# Patient Record
Sex: Male | Born: 1985 | Race: White | Hispanic: No | Marital: Single | Smoking: Former smoker
Health system: Southern US, Community
[De-identification: ages and names within clinical notes are randomized; demographics above are authoritative.]

## PROBLEM LIST (undated history)

## (undated) DIAGNOSIS — F1011 Alcohol abuse, in remission: Secondary | ICD-10-CM

## (undated) DIAGNOSIS — F429 Obsessive-compulsive disorder, unspecified: Secondary | ICD-10-CM

## (undated) DIAGNOSIS — F32A Depression, unspecified: Secondary | ICD-10-CM

## (undated) DIAGNOSIS — R569 Unspecified convulsions: Secondary | ICD-10-CM

## (undated) HISTORY — PX: TIBIA FRACTURE SURGERY: SHX806

## (undated) HISTORY — DX: Depression, unspecified: F32.A

## (undated) HISTORY — DX: Alcohol abuse, in remission: F10.11

## (undated) HISTORY — PX: APPENDECTOMY: SHX54

## (undated) HISTORY — DX: Obsessive-compulsive disorder, unspecified: F42.9

---

## 2016-01-03 ENCOUNTER — Other Ambulatory Visit: Payer: Self-pay | Admitting: Family Medicine

## 2016-01-03 DIAGNOSIS — R8281 Pyuria: Secondary | ICD-10-CM

## 2016-01-08 ENCOUNTER — Ambulatory Visit
Admission: RE | Admit: 2016-01-08 | Discharge: 2016-01-08 | Disposition: A | Discharge status: Home / Self Care | Payer: BLUE CROSS/BLUE SHIELD | Source: Ambulatory Visit | Attending: Family Medicine | Admitting: Family Medicine

## 2016-01-08 DIAGNOSIS — R8281 Pyuria: Secondary | ICD-10-CM

## 2016-01-08 DIAGNOSIS — N261 Atrophy of kidney (terminal): Secondary | ICD-10-CM | POA: Insufficient documentation

## 2016-01-08 DIAGNOSIS — N390 Urinary tract infection, site not specified: Secondary | ICD-10-CM | POA: Diagnosis present

## 2017-07-17 ENCOUNTER — Encounter: Payer: Self-pay | Admitting: Emergency Medicine

## 2017-07-17 ENCOUNTER — Emergency Department
Admission: EM | Admit: 2017-07-17 | Discharge: 2017-07-17 | Disposition: A | Discharge status: Home / Self Care | Payer: BLUE CROSS/BLUE SHIELD | Attending: Emergency Medicine | Admitting: Emergency Medicine

## 2017-07-17 DIAGNOSIS — R1012 Left upper quadrant pain: Secondary | ICD-10-CM | POA: Diagnosis not present

## 2017-07-17 DIAGNOSIS — R197 Diarrhea, unspecified: Secondary | ICD-10-CM | POA: Insufficient documentation

## 2017-07-17 DIAGNOSIS — R112 Nausea with vomiting, unspecified: Secondary | ICD-10-CM | POA: Diagnosis not present

## 2017-07-17 DIAGNOSIS — Z79899 Other long term (current) drug therapy: Secondary | ICD-10-CM | POA: Insufficient documentation

## 2017-07-17 HISTORY — DX: Unspecified convulsions: R56.9

## 2017-07-17 LAB — COMPREHENSIVE METABOLIC PANEL
ALK PHOS: 76 U/L (ref 38–126)
ALT: 30 U/L (ref 17–63)
AST: 27 U/L (ref 15–41)
Albumin: 4.8 g/dL (ref 3.5–5.0)
Anion gap: 13 (ref 5–15)
BILIRUBIN TOTAL: 1 mg/dL (ref 0.3–1.2)
BUN: 16 mg/dL (ref 6–20)
CALCIUM: 9.8 mg/dL (ref 8.9–10.3)
CO2: 26 mmol/L (ref 22–32)
CREATININE: 1.27 mg/dL — AB (ref 0.61–1.24)
Chloride: 102 mmol/L (ref 101–111)
GFR calc non Af Amer: >60 mL/min (ref >60)
GLUCOSE: 122 mg/dL — AB (ref 65–99)
Potassium: 4.2 mmol/L (ref 3.5–5.1)
SODIUM: 141 mmol/L (ref 135–145)
TOTAL PROTEIN: 7.8 g/dL (ref 6.5–8.1)

## 2017-07-17 LAB — URINALYSIS, COMPLETE (UACMP) WITH MICROSCOPIC
Bacteria, UA: NONE SEEN
Bilirubin Urine: NEGATIVE
GLUCOSE, UA: NEGATIVE mg/dL
Hgb urine dipstick: NEGATIVE
Ketones, ur: 80 mg/dL — AB
Leukocytes, UA: NEGATIVE
Nitrite: NEGATIVE
PH: 5 (ref 5.0–8.0)
Protein, ur: 30 mg/dL — AB
RBC / HPF: NONE SEEN RBC/hpf (ref 0–5)
Specific Gravity, Urine: 1.028 (ref 1.005–1.030)
Squamous Epithelial / LPF: NONE SEEN

## 2017-07-17 LAB — CBC
HCT: 43.5 % (ref 40.0–52.0)
Hemoglobin: 14.9 g/dL (ref 13.0–18.0)
MCH: 30.6 pg (ref 26.0–34.0)
MCHC: 34.3 g/dL (ref 32.0–36.0)
MCV: 89.1 fL (ref 80.0–100.0)
PLATELETS: 257 10*3/uL (ref 150–440)
RBC: 4.89 MIL/uL (ref 4.40–5.90)
RDW: 13.9 % (ref 11.5–14.5)
WBC: 13.6 10*3/uL — ABNORMAL HIGH (ref 3.8–10.6)

## 2017-07-17 LAB — LIPASE, BLOOD: Lipase: 22 U/L (ref 11–51)

## 2017-07-17 MED ORDER — HYDROCODONE-ACETAMINOPHEN 5-325 MG PO TABS: 1.0000 | ORAL_TABLET | Freq: Four times a day (QID) | ORAL | 0 refills | Status: DC | PRN

## 2017-07-17 MED ORDER — FAMOTIDINE 20 MG PO TABS
ORAL_TABLET | ORAL | Status: AC
  Administered 2017-07-17: 40 mg via ORAL
  Filled 2017-07-17: qty 2

## 2017-07-17 MED ORDER — GI COCKTAIL ~~LOC~~: 30.0000 mL | Freq: Once | ORAL | Status: DC

## 2017-07-17 MED ORDER — FAMOTIDINE 20 MG PO TABS
40.0000 mg | ORAL_TABLET | Freq: Once | ORAL | Status: AC
  Administered 2017-07-17: 40 mg via ORAL

## 2017-07-17 MED ORDER — HYDROCODONE-ACETAMINOPHEN 5-325 MG PO TABS
2.0000 | ORAL_TABLET | Freq: Once | ORAL | Status: AC
  Administered 2017-07-17: 2 via ORAL
  Filled 2017-07-17: qty 2

## 2017-07-17 NOTE — Discharge Instructions (Signed)
Please take Pepcid twice a day for the next few weeks to help your stomach pain. Make an appointment to follow-up with your primary care physician as needed and return to the emergency department for any new or worsening symptoms such as if you cannot eat or drink, worsening pain, or for any other concerns whatsoever.  It was a pleasure to take care of you today, and thank you for coming to our emergency department.  If you have any questions or concerns before leaving please ask the nurse to grab me and I'm more than happy to go through your aftercare instructions again.  If you were prescribed any opioid pain medication today such as Norco, Vicodin, Percocet, morphine, hydrocodone, or oxycodone please make sure you do not drive when you are taking this medication as it can alter your ability to drive safely.  If you have any concerns once you are home that you are not improving or are in fact getting worse before you can make it to your follow-up appointment, please do not hesitate to call 911 and come back for further evaluation.  Merrily Brittle, MD  Results for orders placed or performed during the hospital encounter of 07/17/17  Lipase, blood  Result Value Ref Range   Lipase 22 11 - 51 U/L  Comprehensive metabolic panel  Result Value Ref Range   Sodium 141 135 - 145 mmol/L   Potassium 4.2 3.5 - 5.1 mmol/L   Chloride 102 101 - 111 mmol/L   CO2 26 22 - 32 mmol/L   Glucose, Bld 122 (H) 65 - 99 mg/dL   BUN 16 6 - 20 mg/dL   Creatinine, Ser 1.61 (H) 0.61 - 1.24 mg/dL   Calcium 9.8 8.9 - 09.6 mg/dL   Total Protein 7.8 6.5 - 8.1 g/dL   Albumin 4.8 3.5 - 5.0 g/dL   AST 27 15 - 41 U/L   ALT 30 17 - 63 U/L   Alkaline Phosphatase 76 38 - 126 U/L   Total Bilirubin 1.0 0.3 - 1.2 mg/dL   GFR calc non Af Amer >60 >60 mL/min   GFR calc Af Amer >60 >60 mL/min   Anion gap 13 5 - 15  CBC  Result Value Ref Range   WBC 13.6 (H) 3.8 - 10.6 K/uL   RBC 4.89 4.40 - 5.90 MIL/uL   Hemoglobin 14.9 13.0  - 18.0 g/dL   HCT 04.5 40.9 - 81.1 %   MCV 89.1 80.0 - 100.0 fL   MCH 30.6 26.0 - 34.0 pg   MCHC 34.3 32.0 - 36.0 g/dL   RDW 91.4 78.2 - 95.6 %   Platelets 257 150 - 440 K/uL  Urinalysis, Complete w Microscopic  Result Value Ref Range   Color, Urine YELLOW (A) YELLOW   APPearance CLEAR (A) CLEAR   Specific Gravity, Urine 1.028 1.005 - 1.030   pH 5.0 5.0 - 8.0   Glucose, UA NEGATIVE NEGATIVE mg/dL   Hgb urine dipstick NEGATIVE NEGATIVE   Bilirubin Urine NEGATIVE NEGATIVE   Ketones, ur 80 (A) NEGATIVE mg/dL   Protein, ur 30 (A) NEGATIVE mg/dL   Nitrite NEGATIVE NEGATIVE   Leukocytes, UA NEGATIVE NEGATIVE   RBC / HPF NONE SEEN 0 - 5 RBC/hpf   WBC, UA 0-5 0 - 5 WBC/hpf   Bacteria, UA NONE SEEN NONE SEEN   Squamous Epithelial / LPF NONE SEEN NONE SEEN   Mucus PRESENT

## 2017-07-17 NOTE — ED Triage Notes (Signed)
Pt ambulatory to triage with no difficulty. Pt reports sudden onset of upper left abd pain around 1 pm. Pt states he has felt like he needed to use the bathroom but could not. Pt denies n/v/d. Pt was seen at [facility] and told possible pancreatitis and was advised to come to the ED for further evaluation.

## 2017-07-17 NOTE — ED Provider Notes (Signed)
[facility] Emergency Department Provider Note  ____________________________________________   First MD Initiated Contact with Patient 07/17/17 2140     (approximate)  I have reviewed the triage vital signs and the nursing notes.   HISTORY  Chief Complaint Abdominal Pain   HPI Stuart Patterson is a 31 y.o. male who is sent to the emergency department from the [facility] for evaluation of abdominal pain. The patient says at 1 PM he had sudden onset severe left upper quadrant abdominal pain. It began shortly after eating a sandwich from a supermarket. The pain is searing aching nonradiating. It seemed to get better somewhat when given a GI cocktail clinic and definitely improved after receiving hydrocodone. He's had some nausea and forced himself to vomit once which did not help He had one loose stool.He has no history of abdominal surgeries. He's had no hematuria or dysuria. He was told in the clinic that there was "a 95% chance this was pancreatitis".   Past Medical History:  Diagnosis Date  . Seizures (HCC)     There are no active problems to display for this patient.   Past Surgical History:  Procedure Laterality Date  . TIBIA FRACTURE SURGERY Right     Prior to Admission medications   Medication Sig Start Date End Date Taking? Authorizing Provider  lamoTRIgine (LAMICTAL) 200 MG tablet Take 200 mg by mouth daily.   Yes [provider]  HYDROcodone-acetaminophen (NORCO) 5-325 MG tablet Take 1 tablet by mouth every 6 (six) hours as needed for severe pain. 07/17/17   Merrily Brittle, MD    Allergies Demerol [meperidine] and Morphine and related  History reviewed. No pertinent family history.  Social History Social History  Substance Use Topics  . Smoking status: Never Smoker  . Smokeless tobacco: Current User  . Alcohol use Not on file    Review of Systems Constitutional: No fever/chills ENT: No sore throat. Cardiovascular:  Denies chest pain. Respiratory: Denies shortness of breath. Gastrointestinal: Positive for abdominal pain.  Positive for nausea, positive for vomiting.  Positive for diarrhea.  No constipation. Musculoskeletal: Negative for back pain. Neurological: Negative for headaches   ____________________________________________   PHYSICAL EXAM:  VITAL SIGNS: ED Triage Vitals  Enc Vitals Group     BP 07/17/17 2033 (!) 102/56     Pulse Rate 07/17/17 2033 70     Resp 07/17/17 2033 18     Temp 07/17/17 2033 98.2 F (36.8 C)     Temp Source 07/17/17 2033 Oral     SpO2 07/17/17 2033 98 %     Weight 07/17/17 2034 205 lb (93 kg)     Height 07/17/17 2034  (1.778 m)     Head Circumference --      Peak Flow --      Pain Score 07/17/17 2033 6     Pain Loc --      Pain Edu? --      Excl. in GC? --     Constitutional: Alert and oriented 4 joking laughing well appearing nontoxic no diaphoresis speaks in full clear sentences Head: Atraumatic. Nose: No congestion/rhinnorhea. Mouth/Throat: No trismus Neck: No stridor.   Cardiovascular: Regular rate and rhythm Respiratory: Normal respiratory effort.  No retractions. Gastrointestinal: Soft nondistended nontender no rebound no guarding no peritonitis no McBurney's tenderness negative Rovsing's no costovertebral tenderness negative Murphy's Neurologic:  Normal speech and language. No gross focal neurologic deficits are appreciated.  Skin:  Skin is warm, dry and intact. No  rash noted.    ____________________________________________  LABS (all labs ordered are listed, but only abnormal results are displayed)  Labs Reviewed  COMPREHENSIVE METABOLIC PANEL - Abnormal; Notable for the following:       Result Value   Glucose, Bld 122 (*)    Creatinine, Ser 1.27 (*)    All other components within normal limits  CBC - Abnormal; Notable for the following:    WBC 13.6 (*)    All other components within normal limits  URINALYSIS, COMPLETE  (UACMP) WITH MICROSCOPIC - Abnormal; Notable for the following:    Color, Urine YELLOW (*)    APPearance CLEAR (*)    Ketones, ur 80 (*)    Protein, ur 30 (*)    All other components within normal limits  LIPASE, BLOOD    Blood work reviewed and interpreted by me shows elevated white count which is nonspecific as well as slightly elevated creatinine likely secondary to dehydration __________________________________________  EKG   ____________________________________________  RADIOLOGY   ____________________________________________   PROCEDURES  Procedure(s) performed: no  Procedures  Critical Care performed: no  Observation: no ____________________________________________   INITIAL IMPRESSION / ASSESSMENT AND PLAN / ED COURSE  Pertinent labs & imaging results that were available during my care of the patient were reviewed by me and considered in my medical decision making (see chart for details).  By the time I saw the patient his pain was resolved and he is extremely well-appearing. His pain is all isolated in his left upper quadrant. It did improve with the GI cocktail. Fortunately his lipase is negative and he is able to eat and drink. I had a lengthy discussion with mom and dad and the patient regarding the diagnostic uncertainty of left upper quadrant pain but that I did not believe he had pancreatitis and I did not believe she warrants advanced imaging at this time as it would be unnecessary radiation without benefit. He verbalized understanding and agreement with the plan. We will give a trial of H2 blocker for the next several weeks with strict return precautions. The patient verbalizes understanding and agreement with the plan.      ____________________________________________   FINAL CLINICAL IMPRESSION(S) / ED DIAGNOSES  Final diagnoses:  Left upper quadrant pain      NEW MEDICATIONS STARTED DURING THIS VISIT:  Discharge Medication List as of  07/17/2017  9:55 PM    START taking these medications   Details  HYDROcodone-acetaminophen (NORCO) 5-325 MG tablet Take 1 tablet by mouth every 6 (six) hours as needed for severe pain., Starting Fri 07/17/2017, Print         Note:  This document was prepared using Dragon voice recognition software and may include unintentional dictation errors.      Merrily Brittle, MD 07/18/17 1439

## 2017-07-17 NOTE — ED Notes (Signed)
Spoke with Dr Mayford Knife about pt and his prior allergies. Pt thinks he takes vicodin ok and order received for the same.

## 2017-08-27 IMAGING — US US RENAL
1 series · 14 of 25 positions shown · non-contrast
Comparison: None.

CLINICAL DATA: Pyuria.

EXAM:
RENAL / URINARY TRACT ULTRASOUND COMPLETE

[Series 1: us renal · 0.26mm/px · 14 of 37 slices shown]
[im 1/37]
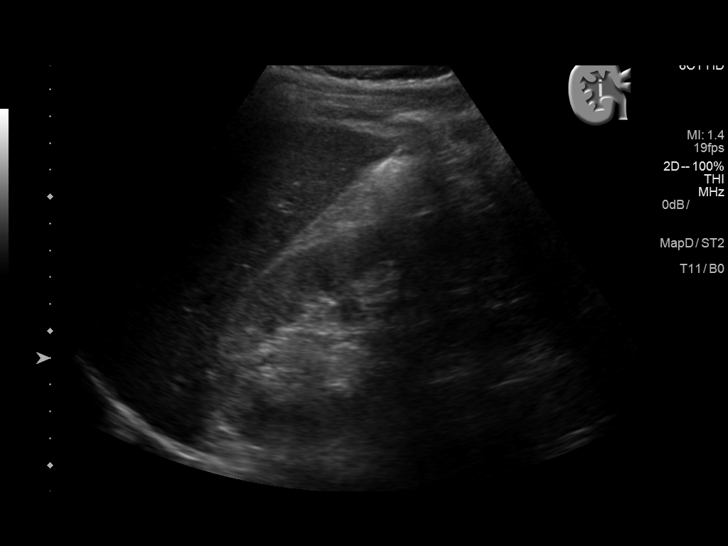
[im 4/37]
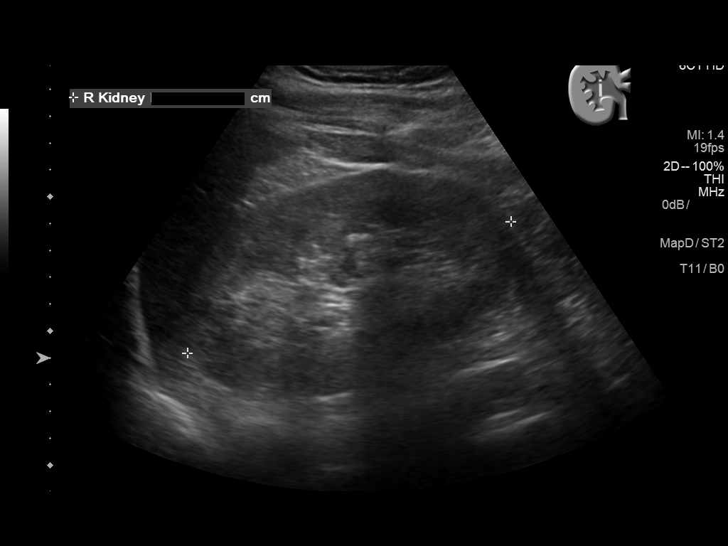
[im 7/37]
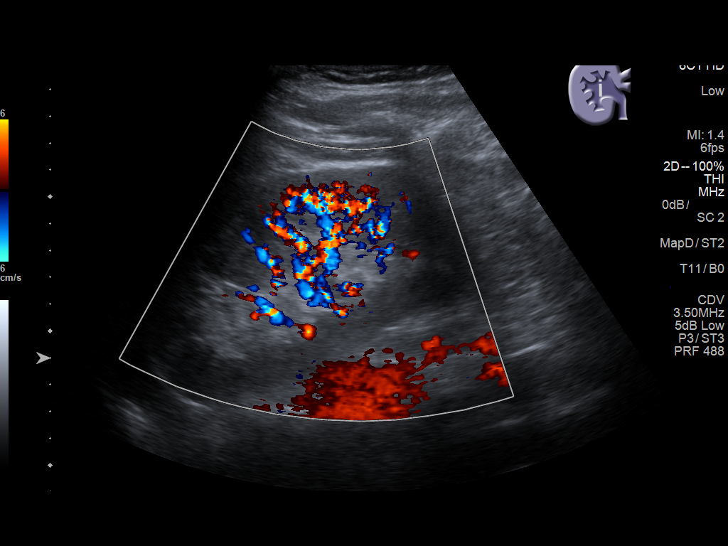
[im 10/37]
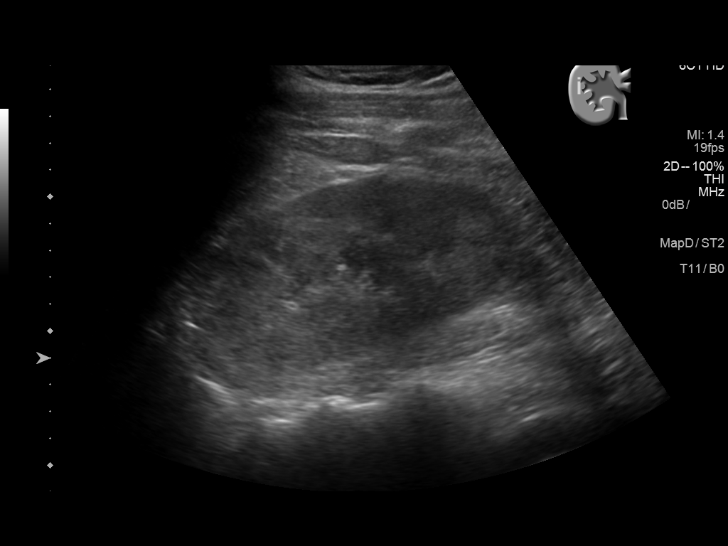
[im 13/37]
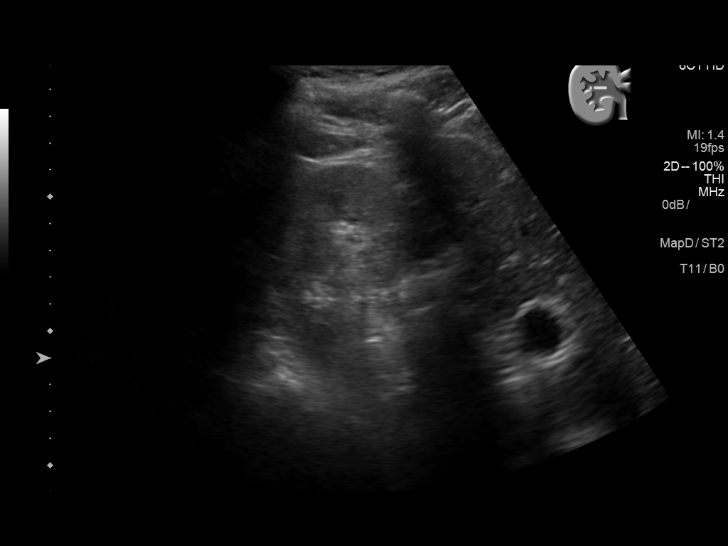
[im 14/37]
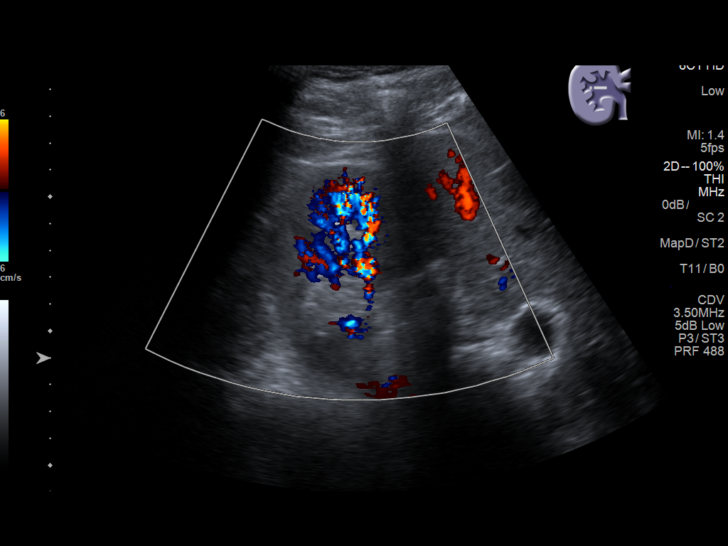
[im 17/37]
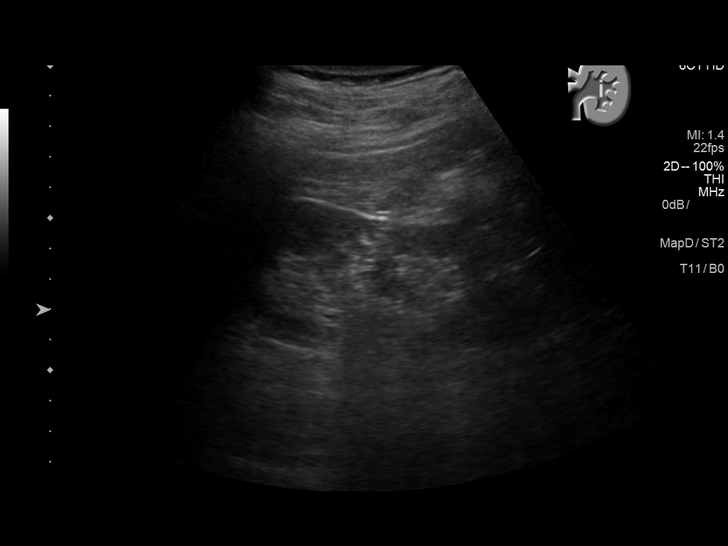
[im 20/37]
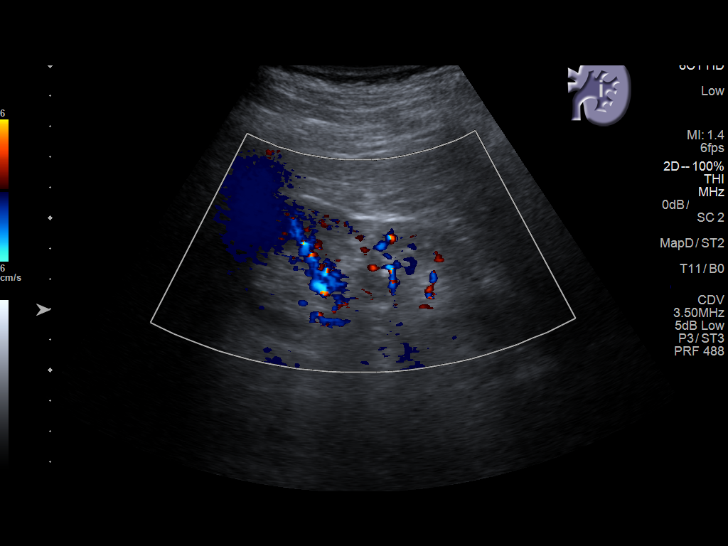
[im 23/37]
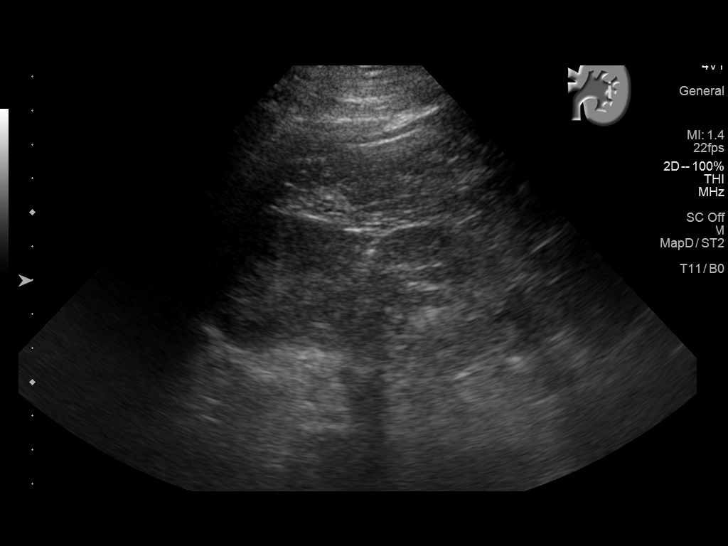
[im 25/37]
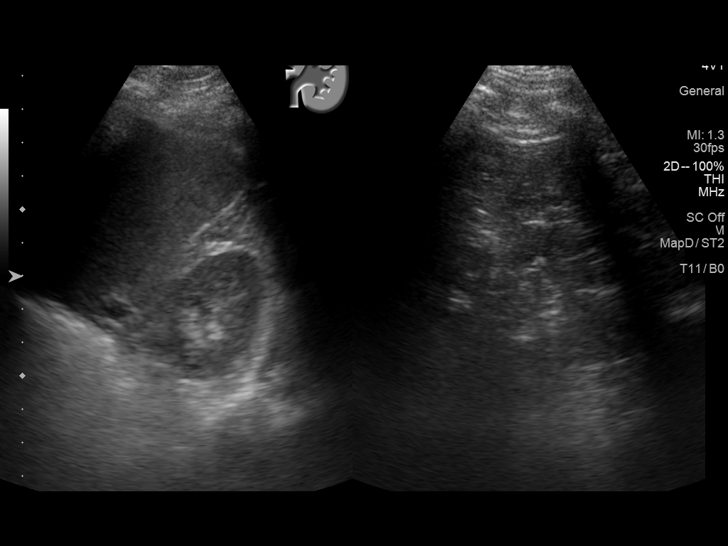
[im 28/37]
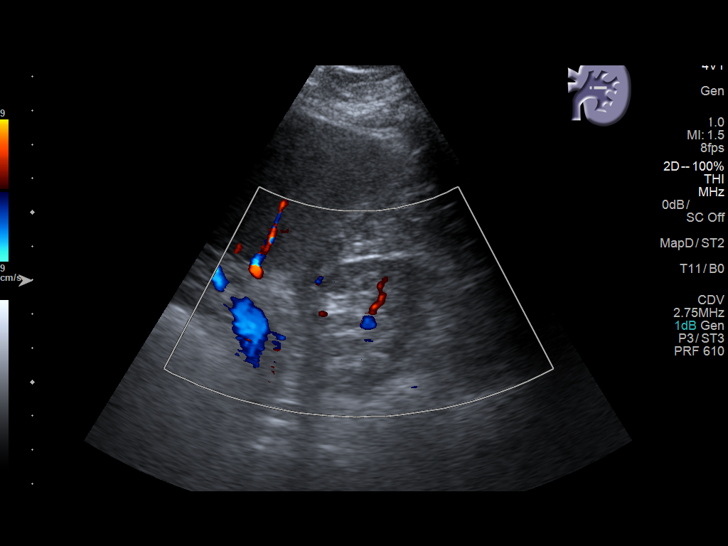
[im 31/37]
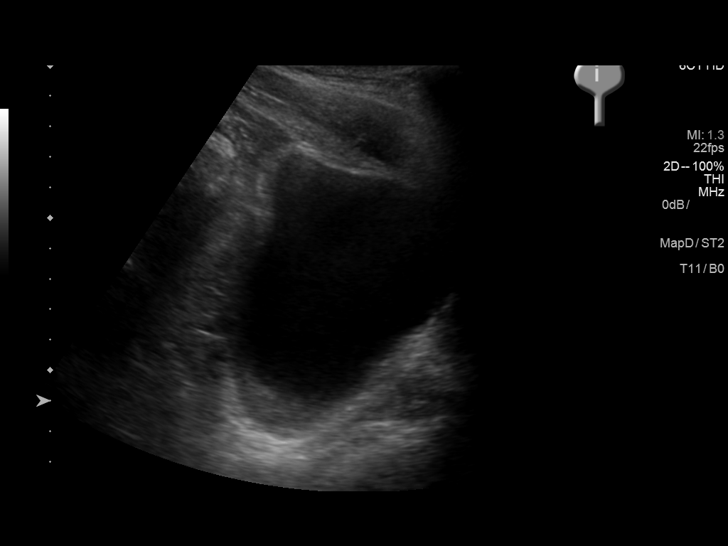
[im 34/37]
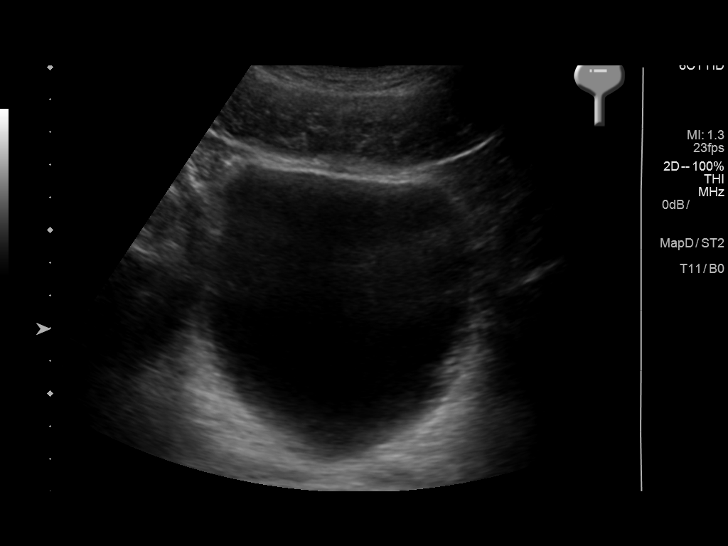
[im 37/37]
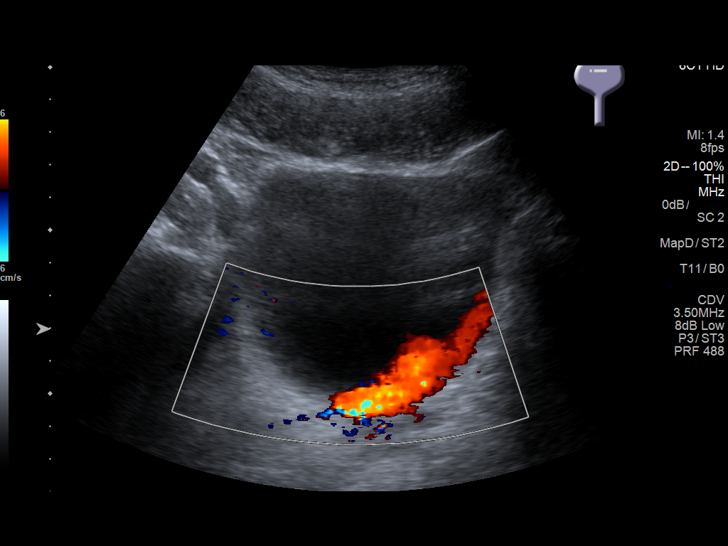

[14 of 25 positions shown; findings below may reference images not displayed]

FINDINGS: Right Kidney:

Length: 13 cm. Echogenicity within normal limits. No mass or
hydronephrosis visualized.

Left Kidney:

Length: 8 cm. Echogenicity within normal limits. No mass or
hydronephrosis visualized.

Bladder:

Appears normal for degree of bladder distention. Bilateral ureteral
jets are noted.
IMPRESSION: Left renal atrophy is noted. No hydronephrosis or renal obstruction
is noted.

## 2018-03-01 ENCOUNTER — Ambulatory Visit: Payer: Self-pay | Admitting: Psychiatry

## 2022-11-27 ENCOUNTER — Ambulatory Visit: Payer: BLUE CROSS/BLUE SHIELD | Admitting: Family Medicine

## 2022-12-08 ENCOUNTER — Ambulatory Visit: Payer: BLUE CROSS/BLUE SHIELD | Admitting: Internal Medicine

## 2022-12-12 ENCOUNTER — Encounter: Payer: Self-pay | Admitting: Nurse Practitioner

## 2022-12-12 ENCOUNTER — Ambulatory Visit (INDEPENDENT_AMBULATORY_CARE_PROVIDER_SITE_OTHER): Payer: BLUE CROSS/BLUE SHIELD | Admitting: Nurse Practitioner

## 2022-12-12 VITALS — BP 120/76 | HR 81 | Temp 97.4°F | Ht 70.0 in | Wt 220.0 lb

## 2022-12-12 DIAGNOSIS — F32A Depression, unspecified: Secondary | ICD-10-CM

## 2022-12-12 DIAGNOSIS — F419 Anxiety disorder, unspecified: Secondary | ICD-10-CM

## 2022-12-12 DIAGNOSIS — Z87898 Personal history of other specified conditions: Secondary | ICD-10-CM | POA: Diagnosis not present

## 2022-12-12 DIAGNOSIS — F258 Other schizoaffective disorders: Secondary | ICD-10-CM | POA: Diagnosis not present

## 2022-12-12 NOTE — Patient Instructions (Addendum)
Labs ordered. Will call with the results. Follow up with the Mental health provider regarding the [place].

## 2022-12-12 NOTE — Progress Notes (Signed)
   New Patient Office Visit  Subjective    Patient ID: Stuart Patterson, male    DOB: 1985-12-27  Age: 37 y.o. MRN: [id number]  CC:  Chief Complaint  Patient presents with  . Establish Care    HPI Stuart Patterson presents to establish care. He has history of schizoprenia. She is seen by mental health provider at [facility].  He lives with his parents. He has H/o epislesy as a child.  ***  Health Maintenance  Topic Date Due  . HIV Screening  Never done  . Hepatitis C Screening  Never done  . DTaP/Tdap/Td (1 - Tdap) Never done  . COVID-19 Vaccine (1) 12/28/2022 (Originally 10/20/1988)  . INFLUENZA VACCINE  01/11/2023 (Originally 05/13/2022)  . HPV VACCINES  Aged Out    There are no preventive care reminders to display for this patient.  Outpatient Encounter Medications as of 12/12/2022  Medication Sig  . HYDROcodone-acetaminophen (NORCO) 5-325 MG tablet Take 1 tablet by mouth every 6 (six) hours as needed for severe pain.  Marland Kitchen lamoTRIgine (LAMICTAL) 200 MG tablet Take 200 mg by mouth daily.   No facility-administered encounter medications on file as of 12/12/2022.    Past Medical History:  Diagnosis Date  . Seizures ([place])     Past Surgical History:  Procedure Laterality Date  . TIBIA FRACTURE SURGERY Right     History reviewed. No pertinent family history.  Social History   Socioeconomic History  . Marital status: Single    Spouse name: Not on file  . Number of children: Not on file  . Years of education: Not on file  . Highest education level: Not on file  Occupational History  . Not on file  Tobacco Use  . Smoking status: Never  . Smokeless tobacco: Current  Substance and Sexual Activity  . Alcohol use: Not on file  . Drug use: Not on file  . Sexual activity: Not on file  Other Topics Concern  . Not on file  Social History Narrative  . Not on file   Social Determinants of Health   Financial Resource Strain: Not on file  Food Insecurity: Not on file   Transportation Needs: Not on file  Physical Activity: Not on file  Stress: Not on file  Social Connections: Not on file  Intimate Partner Violence: Not on file    Review of Systems  Constitutional: Negative.   HENT: Negative.    Eyes: Negative.   Respiratory:  Negative for cough and shortness of breath.   Cardiovascular:  Negative for chest pain and leg swelling.  Genitourinary: Negative.   Musculoskeletal: Negative.   Skin: Negative.   Neurological: Negative.         Objective    BP 120/76   Pulse 81   Temp (!) 97.4 F (36.3 C)   Ht '5\' 10"'$  (1.778 m)   Wt 220 lb (99.8 kg)   SpO2 98%   BMI 31.57 kg/m   Physical Exam  {Labs (Optional):23779}    Assessment & Plan:  There are no diagnoses linked to this encounter.  No follow-ups on file.   Theresia Lo, NP

## 2022-12-13 LAB — CBC WITH DIFFERENTIAL/PLATELET
Basophils Absolute: 0.1 10*3/uL (ref 0.0–0.2)
Basos: 1 %
EOS (ABSOLUTE): 0.2 10*3/uL (ref 0.0–0.4)
Eos: 2 %
Hematocrit: 48.7 % (ref 37.5–51.0)
Hemoglobin: 17.1 g/dL (ref 13.0–17.7)
Immature Grans (Abs): 0 10*3/uL (ref 0.0–0.1)
Immature Granulocytes: 0 %
Lymphocytes Absolute: 3.7 10*3/uL — ABNORMAL HIGH (ref 0.7–3.1)
Lymphs: 35 %
MCH: 30.6 pg (ref 26.6–33.0)
MCHC: 35.1 g/dL (ref 31.5–35.7)
MCV: 87 fL (ref 79–97)
Monocytes Absolute: 0.7 10*3/uL (ref 0.1–0.9)
Monocytes: 7 %
Neutrophils Absolute: 5.9 10*3/uL (ref 1.4–7.0)
Neutrophils: 55 %
Platelets: 393 10*3/uL (ref 150–450)
RBC: 5.58 x10E6/uL (ref 4.14–5.80)
RDW: 12.4 % (ref 11.6–15.4)
WBC: 10.6 10*3/uL (ref 3.4–10.8)

## 2022-12-13 LAB — COMPREHENSIVE METABOLIC PANEL
ALT: 32 IU/L (ref 0–44)
AST: 20 IU/L (ref 0–40)
Albumin/Globulin Ratio: 1.9 (ref 1.2–2.2)
Albumin: 5 g/dL (ref 4.1–5.1)
Alkaline Phosphatase: 97 IU/L (ref 44–121)
BUN/Creatinine Ratio: 8 — ABNORMAL LOW (ref 9–20)
BUN: 10 mg/dL (ref 6–20)
Bilirubin Total: 0.5 mg/dL (ref 0.0–1.2)
CO2: 23 mmol/L (ref 20–29)
Calcium: 10.1 mg/dL (ref 8.7–10.2)
Chloride: 101 mmol/L (ref 96–106)
Creatinine, Ser: 1.24 mg/dL (ref 0.76–1.27)
Globulin, Total: 2.7 g/dL (ref 1.5–4.5)
Glucose: 95 mg/dL (ref 70–99)
Potassium: 4.4 mmol/L (ref 3.5–5.2)
Sodium: 139 mmol/L (ref 134–144)
Total Protein: 7.7 g/dL (ref 6.0–8.5)
eGFR: 78 mL/min/{1.73_m2} (ref >59)

## 2022-12-13 LAB — LIPID PANEL
Chol/HDL Ratio: 6.2 ratio — ABNORMAL HIGH (ref 0.0–5.0)
Cholesterol, Total: 271 mg/dL — ABNORMAL HIGH (ref 100–199)
HDL: 44 mg/dL (ref >39)
LDL Chol Calc (NIH): 202 mg/dL — ABNORMAL HIGH (ref 0–99)
Triglycerides: 136 mg/dL (ref 0–149)
VLDL Cholesterol Cal: 25 mg/dL (ref 5–40)

## 2022-12-13 LAB — TSH: TSH: 2.53 u[IU]/mL (ref 0.450–4.500)

## 2022-12-16 NOTE — Progress Notes (Signed)
Elevated cholesterol and LDL.  Watch diet and follow regular exercise schedule. Liver and kidney functionWNL

## 2022-12-17 ENCOUNTER — Encounter: Payer: Self-pay | Admitting: Nurse Practitioner

## 2022-12-17 DIAGNOSIS — Z87898 Personal history of other specified conditions: Secondary | ICD-10-CM | POA: Insufficient documentation

## 2022-12-17 DIAGNOSIS — F419 Anxiety disorder, unspecified: Secondary | ICD-10-CM | POA: Insufficient documentation

## 2022-12-17 DIAGNOSIS — F258 Other schizoaffective disorders: Secondary | ICD-10-CM | POA: Insufficient documentation

## 2022-12-17 NOTE — Assessment & Plan Note (Signed)
Stable at present. Continue lamotrigine 200 mg daily.

## 2022-12-17 NOTE — Assessment & Plan Note (Signed)
Followed by mental health provider at [facility].

## 2022-12-17 NOTE — Assessment & Plan Note (Addendum)
We will get records from [facility]. PHQ 9 score: 11 and GAD score:6. Followed by mental provider. Labs ordered.

## 2023-01-06 ENCOUNTER — Telehealth: Payer: Self-pay

## 2023-01-06 NOTE — Telephone Encounter (Signed)
Patient following up on psychiatry referral and requesting refills. Are you ok with placing referral and refilling the meds below?

## 2023-01-06 NOTE — Telephone Encounter (Signed)
Patient states he saw Theresia Lo, NP, about a month ago and she was going to refer him to a psychiatrist, but he has not heard from anyone. Patient states he also needs a medication refill.  Prescription Request  01/06/2023  LOV: Visit date not found  What is the name of the medication or equipment?  CAPLYTA 21 MG CAPS and lamoTRIgine (LAMICTAL) 200 MG tablet  Have you contacted your pharmacy to request a refill? Yes, but they said it was going to cost him $1700   Which pharmacy would you like this sent to?   Patient notified that their request is being sent to the clinical staff for review and that they should receive a response within 2 business days.   Please advise at Mobile [phone]

## 2023-01-07 ENCOUNTER — Other Ambulatory Visit: Payer: Self-pay | Admitting: Nurse Practitioner

## 2023-01-07 DIAGNOSIS — F258 Other schizoaffective disorders: Secondary | ICD-10-CM

## 2023-01-07 NOTE — Telephone Encounter (Signed)
Patient is followed by Psy at [facility] but if he wants to see some one else we can send a referral. We can refill lamotrigine but Caplyta should be refilled by psy.

## 2023-01-07 NOTE — Telephone Encounter (Signed)
Referral has been sent Psy. Will refill Caplyta one time but there is no pharmacy added in the chart. Please ask patient regarding the preferred pharmacy.

## 2023-01-07 NOTE — Telephone Encounter (Signed)
Pt mom (maureen) called stating she would like a referral sent to a psy and the pt has been waiting a month for the referral. Pt is experiencing symptoms now and pt mom does not want to have him end up in hospital for him not having his caplyta. Pt mom stated if the provider can have another medication that is equivalent to the other medication that would be fine. Pt mom want to be called [phone]-[phone]

## 2023-01-07 NOTE — Telephone Encounter (Signed)
Please place referral to Psychiatry. Patient almost out of meds having symptoms. Per call from mother but cannot speak with mother due to no DPR specified in chart.

## 2023-01-08 ENCOUNTER — Other Ambulatory Visit: Payer: Self-pay | Admitting: Nurse Practitioner

## 2023-01-08 MED ORDER — LAMOTRIGINE 200 MG PO TABS: 200.0000 mg | ORAL_TABLET | Freq: Every day | ORAL | 1 refills | Status: DC

## 2023-01-08 MED ORDER — CAPLYTA 21 MG PO CAPS: 1.0000 | ORAL_CAPSULE | Freq: Every day | ORAL | 0 refills | Status: DC

## 2023-01-08 NOTE — Telephone Encounter (Signed)
I have added the pharmacy and pended medication. Dosage is not in there?

## 2023-01-09 ENCOUNTER — Ambulatory Visit: Payer: Self-pay | Admitting: Nurse Practitioner

## 2023-01-12 NOTE — Telephone Encounter (Signed)
Patient aware.

## 2023-02-16 MED ORDER — CAPLYTA 21 MG PO CAPS: 1.0000 | ORAL_CAPSULE | Freq: Every day | ORAL | 1 refills | Status: DC

## 2023-02-16 NOTE — Telephone Encounter (Signed)
Pt  mom called stating pt need a refill on CAPLYTA sent to [facility]

## 2023-03-11 DIAGNOSIS — L0231 Cutaneous abscess of buttock: Secondary | ICD-10-CM | POA: Diagnosis not present

## 2023-04-02 DIAGNOSIS — F601 Schizoid personality disorder: Secondary | ICD-10-CM | POA: Diagnosis not present

## 2023-04-09 DIAGNOSIS — F601 Schizoid personality disorder: Secondary | ICD-10-CM | POA: Diagnosis not present

## 2023-04-28 ENCOUNTER — Ambulatory Visit (INDEPENDENT_AMBULATORY_CARE_PROVIDER_SITE_OTHER): Payer: Medicaid Other | Admitting: Nurse Practitioner

## 2023-04-28 ENCOUNTER — Encounter: Payer: Self-pay | Admitting: Nurse Practitioner

## 2023-04-28 VITALS — BP 120/74 | HR 75 | Temp 98.0°F | Resp 16 | Ht 70.0 in | Wt 236.0 lb

## 2023-04-28 DIAGNOSIS — F258 Other schizoaffective disorders: Secondary | ICD-10-CM

## 2023-04-28 DIAGNOSIS — Z87898 Personal history of other specified conditions: Secondary | ICD-10-CM | POA: Diagnosis not present

## 2023-04-28 NOTE — Progress Notes (Signed)
Established Patient Office Visit  Subjective:  Patient ID: Stuart Patterson, male    DOB: 11-09-1985  Age: 37 y.o. MRN: [id number]  CC:  Chief Complaint  Patient presents with   Neurology Referral    HPI  Stuart Patterson presents for referral to neurology. He has history of seizueres, depression and schizoaffective disorder. He is followed by mental health provider  Dr. Georjean Mode at [facility]. The mental health provider would like him to get the neurology consult while taking lamictal and mental health medication.   He has nor been to the neurology since long. His last seizure episodes was 15 year ago and is taking lamictal 200 mg daily.    HPI   Past Medical History:  Diagnosis Date   Depression    Seizures (HCC)     Past Surgical History:  Procedure Laterality Date   APPENDECTOMY     TIBIA FRACTURE SURGERY Right     Family History  Problem Relation Age of Onset   Hearing loss Mother    Arthritis Mother    Depression Mother    Heart disease Father    Arthritis Father    Depression Father    Arthritis Maternal Grandmother    Heart disease Maternal Grandfather    COPD Paternal Grandmother    Early death Paternal Grandfather     Social History   Socioeconomic History   Marital status: Single    Spouse name: Not on file   Number of children: Not on file   Years of education: Not on file   Highest education level: Not on file  Occupational History   Not on file  Tobacco Use   Smoking status: Former    Types: Cigarettes   Smokeless tobacco: Current  Vaping Use   Vaping status: Every Day   Start date: 12/14/2019   Substances: Nicotine  Substance and Sexual Activity   Alcohol use: Not Currently   Drug use: Not Currently   Sexual activity: Not Currently  Other Topics Concern   Not on file  Social History Narrative   Not on file   Social Determinants of Health   Financial Resource Strain: Not on file  Food Insecurity: Not on file  Transportation Needs: Not on  file  Physical Activity: Not on file  Stress: Not on file  Social Connections: Not on file  Intimate Partner Violence: Not on file     Outpatient Medications Prior to Visit  Medication Sig Dispense Refill   Dextromethorphan-buPROPion ER (AUVELITY) 45-105 MG TBCR Take by mouth 2 (two) times daily.     lurasidone (LATUDA) 20 MG TABS tablet Take 20 mg by mouth daily.     lamoTRIgine (LAMICTAL) 200 MG tablet Take 1 tablet (200 mg total) by mouth daily. 90 tablet 1   CAPLYTA 21 MG CAPS Take 1 capsule by mouth daily. 30 capsule 1   CAPLYTA 21 MG CAPS Take 1 capsule by mouth daily. 30 capsule 0   No facility-administered medications prior to visit.    Allergies  Allergen Reactions   Demerol [Meperidine] Other (See Comments)    Thinks it caused a seizure   Morphine And Codeine Other (See Comments)    Thinks it caused a seizure    ROS Review of Systems Negative unless indicated in HPI.    Objective:    Physical Exam Constitutional:      Appearance: Normal appearance.  HENT:     Right Ear: Tympanic membrane normal.     Left Ear: Tympanic membrane  normal.     Mouth/Throat:     Mouth: Mucous membranes are moist.  Eyes:     Conjunctiva/sclera: Conjunctivae normal.     Pupils: Pupils are equal, round, and reactive to light.  Cardiovascular:     Rate and Rhythm: Normal rate and regular rhythm.     Pulses: Normal pulses.     Heart sounds: Normal heart sounds.  Pulmonary:     Effort: Pulmonary effort is normal.     Breath sounds: Normal breath sounds.  Abdominal:     General: Bowel sounds are normal.     Palpations: Abdomen is soft.  Musculoskeletal:     Cervical back: Normal range of motion. No tenderness.  Skin:    General: Skin is warm.     Findings: No bruising.  Neurological:     General: No focal deficit present.     Mental Status: He is alert and oriented to person, place, and time. Mental status is at baseline.  Psychiatric:        Mood and Affect: Mood normal.         Behavior: Behavior normal.        Thought Content: Thought content normal.        Judgment: Judgment normal.     BP 120/74   Pulse 75   Temp 98 F (36.7 C)   Resp 16   Ht 5\' 10"  (1.778 m)   Wt 236 lb (107 kg)   SpO2 98%   BMI 33.86 kg/m  Wt Readings from Last 3 Encounters:  04/28/23 236 lb (107 kg)  12/12/22 220 lb (99.8 kg)  07/17/17 205 lb (93 kg)     Health Maintenance  Topic Date Due   HIV Screening  Never done   Hepatitis C Screening  Never done   DTaP/Tdap/Td (1 - Tdap) Never done   COVID-19 Vaccine (1 - 2023-24 season) Never done   INFLUENZA VACCINE  05/14/2023   HPV VACCINES  Aged Out    There are no preventive care reminders to display for this patient.  Lab Results  Component Value Date   TSH 2.530 12/12/2022   Lab Results  Component Value Date   WBC 10.6 12/12/2022   HGB 17.1 12/12/2022   HCT 48.7 12/12/2022   MCV 87 12/12/2022   PLT 393 12/12/2022   Lab Results  Component Value Date   NA 139 12/12/2022   K 4.4 12/12/2022   CO2 23 12/12/2022   GLUCOSE 95 12/12/2022   BUN 10 12/12/2022   CREATININE 1.24 12/12/2022   BILITOT 0.5 12/12/2022   ALKPHOS 97 12/12/2022   AST 20 12/12/2022   ALT 32 12/12/2022   PROT 7.7 12/12/2022   ALBUMIN 5.0 12/12/2022   CALCIUM 10.1 12/12/2022   ANIONGAP 13 07/17/2017   EGFR 78 12/12/2022   Lab Results  Component Value Date   CHOL 271 (H) 12/12/2022   Lab Results  Component Value Date   HDL 44 12/12/2022   Lab Results  Component Value Date   LDLCALC 202 (H) 12/12/2022   Lab Results  Component Value Date   TRIG 136 12/12/2022   Lab Results  Component Value Date   CHOLHDL 6.2 (H) 12/12/2022   No results found for: "HGBA1C"    Assessment & Plan:  History of seizures Assessment & Plan: Continue lamictal 200 mg. Referral sent to neurology [facility]   Orders: -     Ambulatory referral to Neurology  Other schizoaffective disorders [facility]) Assessment & Plan: Followed  by Dr. Georjean Mode at  [facility].     Follow-up: Return in about 6 months (around 10/29/2023) for chronic management.   Kara Dies, NP

## 2023-04-28 NOTE — Patient Instructions (Addendum)
Referral sent to neurology 6 month follow up.

## 2023-04-28 NOTE — Assessment & Plan Note (Signed)
Followed by Dr. Georjean Mode at [facility].

## 2023-04-28 NOTE — Assessment & Plan Note (Signed)
Continue lamictal 200 mg. Referral sent to neurology [facility]

## 2023-06-18 DIAGNOSIS — F601 Schizoid personality disorder: Secondary | ICD-10-CM | POA: Diagnosis not present

## 2023-06-23 DIAGNOSIS — F601 Schizoid personality disorder: Secondary | ICD-10-CM | POA: Diagnosis not present

## 2023-08-06 ENCOUNTER — Encounter: Payer: Self-pay | Admitting: Nurse Practitioner

## 2023-08-06 ENCOUNTER — Ambulatory Visit: Payer: Medicaid Other | Admitting: Nurse Practitioner

## 2023-08-06 VITALS — BP 136/76 | HR 84 | Temp 98.1°F | Ht 70.0 in | Wt 234.0 lb

## 2023-08-06 DIAGNOSIS — E785 Hyperlipidemia, unspecified: Secondary | ICD-10-CM

## 2023-08-06 DIAGNOSIS — F32A Depression, unspecified: Secondary | ICD-10-CM | POA: Diagnosis not present

## 2023-08-06 DIAGNOSIS — Z23 Encounter for immunization: Secondary | ICD-10-CM | POA: Diagnosis not present

## 2023-08-06 DIAGNOSIS — Z87898 Personal history of other specified conditions: Secondary | ICD-10-CM | POA: Diagnosis not present

## 2023-08-06 DIAGNOSIS — F419 Anxiety disorder, unspecified: Secondary | ICD-10-CM

## 2023-08-06 DIAGNOSIS — F429 Obsessive-compulsive disorder, unspecified: Secondary | ICD-10-CM

## 2023-08-06 LAB — LIPID PANEL
Cholesterol: 259 mg/dL — ABNORMAL HIGH (ref 0–200)
HDL: 45.4 mg/dL (ref >39.00)
LDL Cholesterol: 189 mg/dL — ABNORMAL HIGH (ref 0–99)
NonHDL: 213.42
Total CHOL/HDL Ratio: 6
Triglycerides: 120 mg/dL (ref 0.0–149.0)
VLDL: 24 mg/dL (ref 0.0–40.0)

## 2023-08-06 LAB — COMPREHENSIVE METABOLIC PANEL
ALT: 36 U/L (ref 0–53)
AST: 27 U/L (ref 0–37)
Albumin: 4.7 g/dL (ref 3.5–5.2)
Alkaline Phosphatase: 79 U/L (ref 39–117)
BUN: 9 mg/dL (ref 6–23)
CO2: 28 meq/L (ref 19–32)
Calcium: 9.9 mg/dL (ref 8.4–10.5)
Chloride: 103 meq/L (ref 96–112)
Creatinine, Ser: 1.35 mg/dL (ref 0.40–1.50)
GFR: 68.12 mL/min (ref >60.00)
Glucose, Bld: 101 mg/dL — ABNORMAL HIGH (ref 70–99)
Potassium: 3.9 meq/L (ref 3.5–5.1)
Sodium: 138 meq/L (ref 135–145)
Total Bilirubin: 0.5 mg/dL (ref 0.2–1.2)
Total Protein: 7.6 g/dL (ref 6.0–8.3)

## 2023-08-06 LAB — TSH: TSH: 1.49 u[IU]/mL (ref 0.35–5.50)

## 2023-08-06 MED ORDER — LAMOTRIGINE 200 MG PO TABS: 200.0000 mg | ORAL_TABLET | Freq: Every day | ORAL | 0 refills | Status: DC

## 2023-08-06 NOTE — Patient Instructions (Signed)
Please go to the lab

## 2023-08-06 NOTE — Progress Notes (Unsigned)
Established Patient Office Visit  Subjective:  Patient ID: Stuart Patterson, male    DOB: 10/26/1985  Age: 37 y.o. MRN: [id number]  CC:  Chief Complaint  Patient presents with   Medical Management of Chronic Issues    HPI  Stuart Patterson presents for chronic disease follow up. He lives with his parents. He is a former smoker and now use vape daily.  He has history of seizures, hyperlipidemia, depression, anxiety, OCD and new diagnosis of bipolar.  He is followed by psychiatrist Dr. Georjean Mode at [facility].  He is overall doing better.  He is scheduled for neurology consult.  Patient reports that he has been scheduled to see neurology but appointment has been rescheduled multiple times.  Patient needs refill for Lamictal. HPI   Past Medical History:  Diagnosis Date   Alcohol abuse, in remission    Depression    OCD (obsessive compulsive disorder)    Seizures (HCC)     Past Surgical History:  Procedure Laterality Date   APPENDECTOMY     TIBIA FRACTURE SURGERY Right     Family History  Problem Relation Age of Onset   Hearing loss Mother    Arthritis Mother    Depression Mother    Heart disease Father    Arthritis Father    Depression Father    Arthritis Maternal Grandmother    Heart disease Maternal Grandfather    COPD Paternal Grandmother    Early death Paternal Grandfather     Social History   Socioeconomic History   Marital status: Single    Spouse name: Not on file   Number of children: Not on file   Years of education: Not on file   Highest education level: Not on file  Occupational History   Not on file  Tobacco Use   Smoking status: Former    Types: Cigarettes   Smokeless tobacco: Current  Vaping Use   Vaping status: Every Day   Start date: 12/14/2019   Substances: Nicotine  Substance and Sexual Activity   Alcohol use: Not Currently    Comment: last drink 3 years ago 08/06/23   Drug use: Not Currently   Sexual activity: Not Currently  Other Topics Concern    Not on file  Social History Narrative   Not on file   Social Determinants of Health   Financial Resource Strain: Not on file  Food Insecurity: Not on file  Transportation Needs: Not on file  Physical Activity: Not on file  Stress: Not on file  Social Connections: Not on file  Intimate Partner Violence: Not on file     Outpatient Medications Prior to Visit  Medication Sig Dispense Refill   Dextromethorphan-buPROPion ER (AUVELITY) 45-105 MG TBCR Take by mouth 2 (two) times daily.     lurasidone (LATUDA) 20 MG TABS tablet Take 20 mg by mouth daily.     lamoTRIgine (LAMICTAL) 200 MG tablet Take 1 tablet (200 mg total) by mouth daily. 90 tablet 1   No facility-administered medications prior to visit.    Allergies  Allergen Reactions   Demerol [Meperidine] Other (See Comments)    Thinks it caused a seizure   Morphine And Codeine Other (See Comments)    Thinks it caused a seizure    ROS Review of Systems Negative unless indicated in HPI.    Objective:    Physical Exam Constitutional:      Appearance: Normal appearance.  HENT:     Head: Normocephalic.     Mouth/Throat:  Mouth: Mucous membranes are moist.  Eyes:     Conjunctiva/sclera: Conjunctivae normal.     Pupils: Pupils are equal, round, and reactive to light.  Cardiovascular:     Rate and Rhythm: Normal rate and regular rhythm.     Pulses: Normal pulses.     Heart sounds: Normal heart sounds.  Pulmonary:     Effort: Pulmonary effort is normal.     Breath sounds: Normal breath sounds.  Abdominal:     General: Bowel sounds are normal.     Palpations: Abdomen is soft.  Musculoskeletal:     Cervical back: Normal range of motion. No tenderness.  Skin:    General: Skin is warm.     Findings: No bruising.  Neurological:     General: No focal deficit present.     Mental Status: He is alert and oriented to person, place, and time. Mental status is at baseline.  Psychiatric:        Mood and Affect: Mood  normal.        Behavior: Behavior normal.        Thought Content: Thought content normal.        Judgment: Judgment normal.     BP 136/76   Pulse 84   Temp 98.1 F (36.7 C)   Ht 5\' 10"  (1.778 m)   Wt 234 lb (106.1 kg)   SpO2 96%   BMI 33.58 kg/m  Wt Readings from Last 3 Encounters:  08/06/23 234 lb (106.1 kg)  04/28/23 236 lb (107 kg)  12/12/22 220 lb (99.8 kg)     Health Maintenance  Topic Date Due   HIV Screening  Never done   Hepatitis C Screening  Never done   DTaP/Tdap/Td (1 - Tdap) Never done   COVID-19 Vaccine (1 - 2023-24 season) 08/22/2023 (Originally 06/14/2023)   INFLUENZA VACCINE  Completed   HPV VACCINES  Aged Out    There are no preventive care reminders to display for this patient.  Lab Results  Component Value Date   TSH 1.49 08/06/2023   Lab Results  Component Value Date   WBC 10.6 12/12/2022   HGB 17.1 12/12/2022   HCT 48.7 12/12/2022   MCV 87 12/12/2022   PLT 393 12/12/2022   Lab Results  Component Value Date   NA 138 08/06/2023   K 3.9 08/06/2023   CO2 28 08/06/2023   GLUCOSE 101 (H) 08/06/2023   BUN 9 08/06/2023   CREATININE 1.35 08/06/2023   BILITOT 0.5 08/06/2023   ALKPHOS 79 08/06/2023   AST 27 08/06/2023   ALT 36 08/06/2023   PROT 7.6 08/06/2023   ALBUMIN 4.7 08/06/2023   CALCIUM 9.9 08/06/2023   ANIONGAP 13 07/17/2017   EGFR 78 12/12/2022   GFR 68.12 08/06/2023   Lab Results  Component Value Date   CHOL 259 (H) 08/06/2023   Lab Results  Component Value Date   HDL 45.40 08/06/2023   Lab Results  Component Value Date   LDLCALC 189 (H) 08/06/2023   Lab Results  Component Value Date   TRIG 120.0 08/06/2023   Lab Results  Component Value Date   CHOLHDL 6 08/06/2023   No results found for: "HGBA1C"    Assessment & Plan:  Hyperlipidemia, unspecified hyperlipidemia type Assessment & Plan: Will check lipid panel. Lab Results  Component Value Date   CHOL 259 (H) 08/06/2023   HDL 45.40 08/06/2023   LDLCALC  189 (H) 08/06/2023   TRIG 120.0 08/06/2023   CHOLHDL 6  08/06/2023     Orders: -     Lipid panel -     Comprehensive metabolic panel  History of seizures Assessment & Plan: Scheduled for neurology consult. Refilled Lamictal 200 Mg.  Orders: -     lamoTRIgine; Take 1 tablet (200 mg total) by mouth daily.  Dispense: 90 tablet; Refill: 0  Anxiety and depression Assessment & Plan: Followed by psychiatrist at [facility].    08/06/2023    9:09 AM 12/12/2022    3:02 PM  GAD 7 : Generalized Anxiety Score  Nervous, Anxious, on Edge 2 1  Control/stop worrying 1 2  Worry too much - different things 1 0  Trouble relaxing 2 0  Restless 0 0  Easily annoyed or irritable 2 0  Afraid - awful might happen 1 3  Total GAD 7 Score 9 6  Anxiety Difficulty Very difficult Somewhat difficult    Flowsheet Row Office Visit from 08/06/2023 in [facility]  PHQ-9 Total Score 11        Orders: -     TSH  Encounter for administration of vaccine -     Flu vaccine trivalent PF, 6mos and older(Flulaval,Afluria,Fluarix,Fluzone)    Follow-up: Return in about 6 months (around 02/04/2024) for chronic management.   Kara Dies, NP

## 2023-08-09 ENCOUNTER — Encounter: Payer: Self-pay | Admitting: Nurse Practitioner

## 2023-08-09 DIAGNOSIS — F429 Obsessive-compulsive disorder, unspecified: Secondary | ICD-10-CM | POA: Insufficient documentation

## 2023-08-11 ENCOUNTER — Encounter: Payer: Self-pay | Admitting: Nurse Practitioner

## 2023-08-11 DIAGNOSIS — E785 Hyperlipidemia, unspecified: Secondary | ICD-10-CM | POA: Insufficient documentation

## 2023-08-11 NOTE — Assessment & Plan Note (Signed)
Scheduled for neurology consult. Refilled Lamictal 200 Mg.

## 2023-08-11 NOTE — Assessment & Plan Note (Signed)
Will check lipid panel. Lab Results  Component Value Date   CHOL 259 (H) 08/06/2023   HDL 45.40 08/06/2023   LDLCALC 189 (H) 08/06/2023   TRIG 120.0 08/06/2023   CHOLHDL 6 08/06/2023

## 2023-08-11 NOTE — Assessment & Plan Note (Signed)
Followed by psychiatrist at [facility].    08/06/2023    9:09 AM 12/12/2022    3:02 PM  GAD 7 : Generalized Anxiety Score  Nervous, Anxious, on Edge 2 1  Control/stop worrying 1 2  Worry too much - different things 1 0  Trouble relaxing 2 0  Restless 0 0  Easily annoyed or irritable 2 0  Afraid - awful might happen 1 3  Total GAD 7 Score 9 6  Anxiety Difficulty Very difficult Somewhat difficult    Flowsheet Row Office Visit from 08/06/2023 in [facility] Total Score 11

## 2023-10-29 ENCOUNTER — Ambulatory Visit: Payer: Medicaid Other | Admitting: Nurse Practitioner

## 2023-11-03 ENCOUNTER — Other Ambulatory Visit: Payer: Self-pay | Admitting: Nurse Practitioner

## 2023-11-03 DIAGNOSIS — Z87898 Personal history of other specified conditions: Secondary | ICD-10-CM

## 2023-11-05 ENCOUNTER — Encounter: Payer: Self-pay | Admitting: Nurse Practitioner

## 2023-11-05 ENCOUNTER — Telehealth: Payer: Self-pay | Admitting: Nurse Practitioner

## 2023-11-05 ENCOUNTER — Ambulatory Visit: Payer: MEDICAID | Admitting: Nurse Practitioner

## 2023-11-05 VITALS — BP 116/76 | HR 94 | Temp 98.0°F | Ht 70.0 in | Wt 232.4 lb

## 2023-11-05 DIAGNOSIS — Z0000 Encounter for general adult medical examination without abnormal findings: Secondary | ICD-10-CM

## 2023-11-05 DIAGNOSIS — E785 Hyperlipidemia, unspecified: Secondary | ICD-10-CM

## 2023-11-05 DIAGNOSIS — F258 Other schizoaffective disorders: Secondary | ICD-10-CM

## 2023-11-05 DIAGNOSIS — Z87898 Personal history of other specified conditions: Secondary | ICD-10-CM

## 2023-11-05 DIAGNOSIS — E538 Deficiency of other specified B group vitamins: Secondary | ICD-10-CM | POA: Diagnosis not present

## 2023-11-05 DIAGNOSIS — F32A Depression, unspecified: Secondary | ICD-10-CM

## 2023-11-05 NOTE — Assessment & Plan Note (Addendum)
Stable on medication. -Followed by Neurology.

## 2023-11-05 NOTE — Assessment & Plan Note (Signed)
Stable at present.  -Followed by psychiatry.

## 2023-11-05 NOTE — Assessment & Plan Note (Signed)
Patient is currently seeing a psychiatrist every two months but is looking for a new provider due to insurance changes. -Continue current psychiatric medications:Lamictal, dextromethorphan/buprenorphine, and Latuda.

## 2023-11-05 NOTE — Assessment & Plan Note (Signed)
Recent blood tests revealed low B12 levels (173). Patient scheduled for B12 injection at neurology office. -Administer B12 injection at neurology office.

## 2023-11-05 NOTE — Progress Notes (Signed)
Established Patient Office Visit  Subjective:  Patient ID: Stuart Patterson, male    DOB: 1986/04/25  Age: 38 y.o. MRN: [id number]  CC:  Chief Complaint  Patient presents with   Medical Management of Chronic Issues  Discussed the use of a AI scribe software for clinical note transcription with the patient, who gave verbal consent to proceed.   HPI  Stuart Patterson presents for routine follow up. He has recently seen by neurology.  He underwent a series of blood tests revealing a B12 deficiency, for which he is scheduled to receive an injection.   He is under psychiatric care, with visits scheduled every two months. However, he is seeking a new psychiatrist due to changes in his insurance.   He is compliant with medication and no recent changes. He has recently started exercising after a few years of inactivity, primarily walking and hiking with his dog.  He is doing well and no concerns today.   HPI   Past Medical History:  Diagnosis Date   Alcohol abuse, in remission    Depression    OCD (obsessive compulsive disorder)    Seizures (HCC)     Past Surgical History:  Procedure Laterality Date   APPENDECTOMY     TIBIA FRACTURE SURGERY Right     Family History  Problem Relation Age of Onset   Hearing loss Mother    Arthritis Mother    Depression Mother    Heart disease Father    Arthritis Father    Depression Father    Arthritis Maternal Grandmother    Heart disease Maternal Grandfather    COPD Paternal Grandmother    Early death Paternal Grandfather     Social History   Socioeconomic History   Marital status: Single    Spouse name: Not on file   Number of children: Not on file   Years of education: Not on file   Highest education level: Not on file  Occupational History   Not on file  Tobacco Use   Smoking status: Former    Types: Cigarettes   Smokeless tobacco: Current  Vaping Use   Vaping status: Every Day   Start date: 12/14/2019   Substances:  Nicotine  Substance and Sexual Activity   Alcohol use: Not Currently    Comment: last drink 3 years ago 08/06/23   Drug use: Not Currently   Sexual activity: Not Currently  Other Topics Concern   Not on file  Social History Narrative   Not on file   Social Drivers of Health   Financial Resource Strain: Not on file  Food Insecurity: Not on file  Transportation Needs: Not on file  Physical Activity: Not on file  Stress: Not on file  Social Connections: Not on file  Intimate Partner Violence: Not on file     Outpatient Medications Prior to Visit  Medication Sig Dispense Refill   Dextromethorphan-buPROPion ER (AUVELITY) 45-105 MG TBCR Take by mouth 2 (two) times daily.     lamoTRIgine (LAMICTAL) 200 MG tablet TAKE 1 TABLET (200 MG TOTAL) BY MOUTH DAILY. 90 tablet 0   lurasidone (LATUDA) 20 MG TABS tablet Take 20 mg by mouth daily.     No facility-administered medications prior to visit.    Allergies  Allergen Reactions   Demerol [Meperidine] Other (See Comments)    Thinks it caused a seizure   Morphine And Codeine Other (See Comments)    Thinks it caused a seizure    ROS Review of Systems  Negative unless indicated in HPI.    Objective:    Physical Exam Constitutional:      Appearance: Normal appearance.  HENT:     Mouth/Throat:     Mouth: Mucous membranes are moist.  Eyes:     Conjunctiva/sclera: Conjunctivae normal.     Pupils: Pupils are equal, round, and reactive to light.  Cardiovascular:     Rate and Rhythm: Normal rate and regular rhythm.     Pulses: Normal pulses.     Heart sounds: Normal heart sounds.  Pulmonary:     Effort: Pulmonary effort is normal.     Breath sounds: Normal breath sounds.  Abdominal:     General: Bowel sounds are normal.     Palpations: Abdomen is soft.  Musculoskeletal:     Cervical back: Normal range of motion. No tenderness.  Skin:    General: Skin is warm.     Findings: No bruising.  Neurological:     General: No  focal deficit present.     Mental Status: He is alert and oriented to person, place, and time. Mental status is at baseline.  Psychiatric:        Mood and Affect: Mood normal.        Behavior: Behavior normal.        Thought Content: Thought content normal.        Judgment: Judgment normal.     BP 116/76   Pulse 94   Temp 98 F (36.7 C)   Ht 5\' 10"  (1.778 m)   Wt 232 lb 6.4 oz (105.4 kg)   SpO2 97%   BMI 33.35 kg/m  Wt Readings from Last 3 Encounters:  11/05/23 232 lb 6.4 oz (105.4 kg)  08/06/23 234 lb (106.1 kg)  04/28/23 236 lb (107 kg)     Health Maintenance  Topic Date Due   HIV Screening  Never done   Hepatitis C Screening  Never done   DTaP/Tdap/Td (1 - Tdap) Never done   COVID-19 Vaccine (1 - 2024-25 season) 11/13/2023 (Originally 06/14/2023)   INFLUENZA VACCINE  Completed   HPV VACCINES  Aged Out    There are no preventive care reminders to display for this patient.  Lab Results  Component Value Date   TSH 1.49 08/06/2023   Lab Results  Component Value Date   WBC 10.6 12/12/2022   HGB 17.1 12/12/2022   HCT 48.7 12/12/2022   MCV 87 12/12/2022   PLT 393 12/12/2022   Lab Results  Component Value Date   NA 138 08/06/2023   K 3.9 08/06/2023   CO2 28 08/06/2023   GLUCOSE 101 (H) 08/06/2023   BUN 9 08/06/2023   CREATININE 1.35 08/06/2023   BILITOT 0.5 08/06/2023   ALKPHOS 79 08/06/2023   AST 27 08/06/2023   ALT 36 08/06/2023   PROT 7.6 08/06/2023   ALBUMIN 4.7 08/06/2023   CALCIUM 9.9 08/06/2023   ANIONGAP 13 07/17/2017   EGFR 78 12/12/2022   GFR 68.12 08/06/2023   Lab Results  Component Value Date   CHOL 259 (H) 08/06/2023   Lab Results  Component Value Date   HDL 45.40 08/06/2023   Lab Results  Component Value Date   LDLCALC 189 (H) 08/06/2023   Lab Results  Component Value Date   TRIG 120.0 08/06/2023   Lab Results  Component Value Date   CHOLHDL 6 08/06/2023   No results found for: "HGBA1C"    Assessment & Plan:   Hyperlipidemia, unspecified hyperlipidemia type Assessment &  Plan: Will check lipid panel. Lab Results  Component Value Date   CHOL 259 (H) 08/06/2023   HDL 45.40 08/06/2023   LDLCALC 189 (H) 08/06/2023   TRIG 120.0 08/06/2023   CHOLHDL 6 08/06/2023  Manage diet and exercise.    Annual physical exam -     CBC with Differential/Platelet; Future -     Comprehensive metabolic panel; Future -     Lipid panel; Future -     TSH; Future  History of seizures Assessment & Plan: Stable on medication. -Followed by Neurology.   Other schizoaffective disorders (HCC) Assessment & Plan: Patient is currently seeing a psychiatrist every two months but is looking for a new provider due to insurance changes. -Continue current psychiatric medications:Lamictal, dextromethorphan/buprenorphine, and Latuda.   Anxiety and depression Assessment & Plan: Stable at present.  -Followed by psychiatry.      Vitamin B12 deficiency Assessment & Plan: Recent blood tests revealed low B12 levels (173). Patient scheduled for B12 injection at neurology office. -Administer B12 injection at neurology office.     Follow-up: Return in about 6 months (around 05/04/2024) for physical, follow up with fasting lab 2 days prior.   Kara Dies, NP

## 2023-11-05 NOTE — Telephone Encounter (Signed)
Requested Psychiatry records from [facility].

## 2023-11-05 NOTE — Assessment & Plan Note (Signed)
Will check lipid panel. Lab Results  Component Value Date   CHOL 259 (H) 08/06/2023   HDL 45.40 08/06/2023   LDLCALC 189 (H) 08/06/2023   TRIG 120.0 08/06/2023   CHOLHDL 6 08/06/2023  Manage diet and exercise.

## 2024-04-29 ENCOUNTER — Other Ambulatory Visit: Payer: Self-pay | Admitting: Nurse Practitioner

## 2024-04-29 DIAGNOSIS — Z87898 Personal history of other specified conditions: Secondary | ICD-10-CM

## 2024-05-06 ENCOUNTER — Ambulatory Visit: Payer: MEDICAID | Admitting: Nurse Practitioner

## 2024-05-06 ENCOUNTER — Encounter: Payer: Self-pay | Admitting: Nurse Practitioner

## 2024-05-06 VITALS — BP 120/78 | HR 87 | Temp 98.9°F | Ht 70.0 in | Wt 236.2 lb

## 2024-05-06 DIAGNOSIS — Z23 Encounter for immunization: Secondary | ICD-10-CM | POA: Diagnosis not present

## 2024-05-06 DIAGNOSIS — F258 Other schizoaffective disorders: Secondary | ICD-10-CM

## 2024-05-06 DIAGNOSIS — F419 Anxiety disorder, unspecified: Secondary | ICD-10-CM | POA: Diagnosis not present

## 2024-05-06 DIAGNOSIS — Z87898 Personal history of other specified conditions: Secondary | ICD-10-CM | POA: Diagnosis not present

## 2024-05-06 DIAGNOSIS — E785 Hyperlipidemia, unspecified: Secondary | ICD-10-CM

## 2024-05-06 DIAGNOSIS — F32A Depression, unspecified: Secondary | ICD-10-CM | POA: Diagnosis not present

## 2024-05-06 LAB — CBC WITH DIFFERENTIAL/PLATELET
Basophils Absolute: 0.1 K/uL (ref 0.0–0.1)
Basophils Relative: 1.1 % (ref 0.0–3.0)
Eosinophils Absolute: 0.5 K/uL (ref 0.0–0.7)
Eosinophils Relative: 6.8 % — ABNORMAL HIGH (ref 0.0–5.0)
HCT: 45.5 % (ref 39.0–52.0)
Hemoglobin: 15.3 g/dL (ref 13.0–17.0)
Lymphocytes Relative: 39.2 % (ref 12.0–46.0)
Lymphs Abs: 3.1 K/uL (ref 0.7–4.0)
MCHC: 33.6 g/dL (ref 30.0–36.0)
MCV: 87 fl (ref 78.0–100.0)
Monocytes Absolute: 0.6 K/uL (ref 0.1–1.0)
Monocytes Relative: 7.1 % (ref 3.0–12.0)
Neutro Abs: 3.6 K/uL (ref 1.4–7.7)
Neutrophils Relative %: 45.8 % (ref 43.0–77.0)
Platelets: 379 K/uL (ref 150.0–400.0)
RBC: 5.23 Mil/uL (ref 4.22–5.81)
RDW: 13.6 % (ref 11.5–15.5)
WBC: 7.9 K/uL (ref 4.0–10.5)

## 2024-05-06 LAB — LIPID PANEL
Cholesterol: 237 mg/dL — ABNORMAL HIGH (ref 0–200)
HDL: 42.9 mg/dL (ref >39.00)
LDL Cholesterol: 163 mg/dL — ABNORMAL HIGH (ref 0–99)
NonHDL: 193.93
Total CHOL/HDL Ratio: 6
Triglycerides: 155 mg/dL — ABNORMAL HIGH (ref 0.0–149.0)
VLDL: 31 mg/dL (ref 0.0–40.0)

## 2024-05-06 LAB — COMPREHENSIVE METABOLIC PANEL WITH GFR
ALT: 33 U/L (ref 0–53)
AST: 21 U/L (ref 0–37)
Albumin: 4.7 g/dL (ref 3.5–5.2)
Alkaline Phosphatase: 71 U/L (ref 39–117)
BUN: 9 mg/dL (ref 6–23)
CO2: 26 meq/L (ref 19–32)
Calcium: 9.7 mg/dL (ref 8.4–10.5)
Chloride: 104 meq/L (ref 96–112)
Creatinine, Ser: 1.19 mg/dL (ref 0.40–1.50)
GFR: 78.84 mL/min (ref >60.00)
Glucose, Bld: 93 mg/dL (ref 70–99)
Potassium: 4.1 meq/L (ref 3.5–5.1)
Sodium: 139 meq/L (ref 135–145)
Total Bilirubin: 0.4 mg/dL (ref 0.2–1.2)
Total Protein: 7.2 g/dL (ref 6.0–8.3)

## 2024-05-06 LAB — TSH: TSH: 1.07 u[IU]/mL (ref 0.35–5.50)

## 2024-05-15 NOTE — Progress Notes (Signed)
 Established Patient Office Visit  Subjective:  Patient ID: Stuart Patterson, male    DOB: 10-Jan-1986  Age: 38 y.o. MRN: [id number]  CC:  Chief Complaint  Patient presents with   Medical Management of Chronic Issues    6 month check up. Pt stated he had a tick bite under his arm pit.    Discussed the use of a AI scribe software for clinical note transcription with the patient, who gave verbal consent to proceed.  HPI  Stuart Patterson is a 38 year old male who presents for a regular six-month follow-up.  He found a small, dead tick on his armpit recently and removed it without noticing any redness or marks. He is concerned about the possibility of contracting Lyme disease again but has no symptoms such as fever, nausea, or vomiting.  He is currently taking Lamictal  once daily.  Latuda once daily. He is transitioning to a new psychiatrist due to the retirement of his previous psychiatrist and is experiencing delays in neuro evaluations.  He has been abstinent from drugs and alcohol for years. He denies a history of opioid addiction and reports an allergy to opiates such as Demerol, morphine, and codeine.  HPI   Past Medical History:  Diagnosis Date   Alcohol abuse, in remission    Depression    OCD (obsessive compulsive disorder)    Seizures (HCC)     Past Surgical History:  Procedure Laterality Date   APPENDECTOMY     TIBIA FRACTURE SURGERY Right     Family History  Problem Relation Age of Onset   Hearing loss Mother    Arthritis Mother    Depression Mother    Heart disease Father    Arthritis Father    Depression Father    Arthritis Maternal Grandmother    Heart disease Maternal Grandfather    COPD Paternal Grandmother    Early death Paternal Grandfather     Social History   Socioeconomic History   Marital status: Single    Spouse name: Not on file   Number of children: Not on file   Years of education: Not on file   Highest education level: Not on file   Occupational History   Not on file  Tobacco Use   Smoking status: Former    Types: Cigarettes   Smokeless tobacco: Current  Vaping Use   Vaping status: Every Day   Start date: 12/14/2019   Substances: Nicotine  Substance and Sexual Activity   Alcohol use: Not Currently    Comment: last drink 3 years ago 08/06/23   Drug use: Not Currently   Sexual activity: Not Currently  Other Topics Concern   Not on file  Social History Narrative   Not on file   Social Drivers of Health   Financial Resource Strain: Not on file  Food Insecurity: Not on file  Transportation Needs: Not on file  Physical Activity: Not on file  Stress: Not on file  Social Connections: Not on file  Intimate Partner Violence: Not on file     Outpatient Medications Prior to Visit  Medication Sig Dispense Refill   Dextromethorphan-buPROPion ER (AUVELITY) 45-105 MG TBCR Take by mouth 2 (two) times daily.     lamoTRIgine  (LAMICTAL ) 200 MG tablet TAKE 1 TABLET DAILY 90 tablet 0   lurasidone (LATUDA) 20 MG TABS tablet Take 20 mg by mouth daily.     No facility-administered medications prior to visit.    Allergies  Allergen Reactions   Demerol [  Meperidine] Other (See Comments)    Thinks it caused a seizure   Morphine And Codeine Other (See Comments)    Thinks it caused a seizure    ROS Review of Systems Negative unless indicated in HPI.    Objective:    Physical Exam Constitutional:      Appearance: Normal appearance.  HENT:     Mouth/Throat:     Mouth: Mucous membranes are moist.  Eyes:     Conjunctiva/sclera: Conjunctivae normal.     Pupils: Pupils are equal, round, and reactive to light.  Cardiovascular:     Rate and Rhythm: Normal rate and regular rhythm.     Pulses: Normal pulses.     Heart sounds: Normal heart sounds.  Pulmonary:     Effort: Pulmonary effort is normal.     Breath sounds: Normal breath sounds.  Musculoskeletal:     Cervical back: Normal range of motion. No tenderness.   Skin:    General: Skin is warm.     Findings: No bruising or rash.  Neurological:     General: No focal deficit present.     Mental Status: He is alert and oriented to person, place, and time. Mental status is at baseline.  Psychiatric:        Mood and Affect: Mood normal.        Behavior: Behavior normal.        Thought Content: Thought content normal.        Judgment: Judgment normal.     BP 120/78 (BP Location: Right Arm, Patient Position: Sitting, Cuff Size: Normal)   Pulse 87   Temp 98.9 F (37.2 C) (Oral)   Ht 5' 10 (1.778 m)   Wt 236 lb 3.2 oz (107.1 kg)   SpO2 96%   BMI 33.89 kg/m  Wt Readings from Last 3 Encounters:  05/06/24 236 lb 3.2 oz (107.1 kg)  11/05/23 232 lb 6.4 oz (105.4 kg)  08/06/23 234 lb (106.1 kg)     Health Maintenance  Topic Date Due   HIV Screening  Never done   Hepatitis C Screening  Never done   Hepatitis B Vaccines (1 of 3 - 19+ 3-dose series) Never done   HPV VACCINES (1 - 3-dose SCDM series) Never done   INFLUENZA VACCINE  05/13/2024   COVID-19 Vaccine (4 - 2024-25 season) 05/21/2024 (Originally 06/14/2023)   DTaP/Tdap/Td (2 - Td or Tdap) 05/06/2034   Meningococcal B Vaccine  Aged Out       Topic Date Due   Hepatitis B Vaccines (1 of 3 - 19+ 3-dose series) Never done   HPV VACCINES (1 - 3-dose SCDM series) Never done    Lab Results  Component Value Date   TSH 1.07 05/06/2024   Lab Results  Component Value Date   WBC 7.9 05/06/2024   HGB 15.3 05/06/2024   HCT 45.5 05/06/2024   MCV 87.0 05/06/2024   PLT 379.0 05/06/2024   Lab Results  Component Value Date   NA 139 05/06/2024   K 4.1 05/06/2024   CO2 26 05/06/2024   GLUCOSE 93 05/06/2024   BUN 9 05/06/2024   CREATININE 1.19 05/06/2024   BILITOT 0.4 05/06/2024   ALKPHOS 71 05/06/2024   AST 21 05/06/2024   ALT 33 05/06/2024   PROT 7.2 05/06/2024   ALBUMIN 4.7 05/06/2024   CALCIUM 9.7 05/06/2024   ANIONGAP 13 07/17/2017   EGFR 78 12/12/2022   GFR 78.84 05/06/2024    Lab Results  Component Value  Date   CHOL 237 (H) 05/06/2024   Lab Results  Component Value Date   HDL 42.90 05/06/2024   Lab Results  Component Value Date   LDLCALC 163 (H) 05/06/2024   Lab Results  Component Value Date   TRIG 155.0 (H) 05/06/2024   Lab Results  Component Value Date   CHOLHDL 6 05/06/2024   No results found for: HGBA1C    Assessment & Plan:  Hyperlipidemia, unspecified hyperlipidemia type Assessment & Plan: Will check lipid panel.  Orders: -     Lipid panel  History of seizures Assessment & Plan: Stable on medication. - Continue lamotrigine  200mg  daily.    Anxiety and depression Assessment & Plan: Stable on medication.  Orders: -     CBC with Differential/Platelet -     Comprehensive metabolic panel with GFR -     TSH  Need for diphtheria-tetanus-pertussis (Tdap) vaccine -     Tdap vaccine greater than or equal to 7yo IM  Other schizoaffective disorders (HCC) Assessment & Plan: Followed by psychiatry.   Recent tick exposure with low transmission risk. No symptoms present. Concerned about reinfection. - Monitor for symptoms such as fever, nausea, and vomiting.  Follow-up: No follow-ups on file.   Jaan Fischel, NP

## 2024-05-15 NOTE — Progress Notes (Incomplete)
 Established Patient Office Visit  Subjective:  Patient ID: Stuart Patterson, male    DOB: 1986/02/08  Age: 38 y.o. MRN: [id number]  CC:  Chief Complaint  Patient presents with  . Medical Management of Chronic Issues    6 month check up. Pt stated he had a tick bite under his arm pit.    Discussed the use of a AI scribe software for clinical note transcription with the patient, who gave verbal consent to proceed.  HPI  Stuart Patterson is a 38 year old male who presents for a regular six-month follow-up.  He found a small, dead tick on his armpit recently and removed it without noticing any redness or marks. He is concerned about the possibility of contracting Lyme disease again but has no symptoms such as fever, nausea, or vomiting.  He is currently taking Lamictal  once daily, Avalide twice daily, and Latuda once daily. He is transitioning to a new psychiatrist due to the retirement of his previous psychiatrist and is experiencing delays in neuro evaluations.  He has been abstinent from drugs and alcohol for years. He denies a history of opioid addiction and reports an allergy to opiates such as Demerol, morphine, and codeine.  HPI   Past Medical History:  Diagnosis Date  . Alcohol abuse, in remission   . Depression   . OCD (obsessive compulsive disorder)   . Seizures (HCC)     Past Surgical History:  Procedure Laterality Date  . APPENDECTOMY    . TIBIA FRACTURE SURGERY Right     Family History  Problem Relation Age of Onset  . Hearing loss Mother   . Arthritis Mother   . Depression Mother   . Heart disease Father   . Arthritis Father   . Depression Father   . Arthritis Maternal Grandmother   . Heart disease Maternal Grandfather   . COPD Paternal Grandmother   . Early death Paternal Grandfather     Social History   Socioeconomic History  . Marital status: Single    Spouse name: Not on file  . Number of children: Not on file  . Years of education: Not on file   . Highest education level: Not on file  Occupational History  . Not on file  Tobacco Use  . Smoking status: Former    Types: Cigarettes  . Smokeless tobacco: Current  Vaping Use  . Vaping status: Every Day  . Start date: 12/14/2019  . Substances: Nicotine  Substance and Sexual Activity  . Alcohol use: Not Currently    Comment: last drink 3 years ago 08/06/23  . Drug use: Not Currently  . Sexual activity: Not Currently  Other Topics Concern  . Not on file  Social History Narrative  . Not on file   Social Drivers of Health   Financial Resource Strain: Not on file  Food Insecurity: Not on file  Transportation Needs: Not on file  Physical Activity: Not on file  Stress: Not on file  Social Connections: Not on file  Intimate Partner Violence: Not on file     Outpatient Medications Prior to Visit  Medication Sig Dispense Refill  . Dextromethorphan-buPROPion ER (AUVELITY) 45-105 MG TBCR Take by mouth 2 (two) times daily.    . lamoTRIgine  (LAMICTAL ) 200 MG tablet TAKE 1 TABLET DAILY 90 tablet 0  . lurasidone (LATUDA) 20 MG TABS tablet Take 20 mg by mouth daily.     No facility-administered medications prior to visit.    Allergies  Allergen Reactions  .  Demerol [Meperidine] Other (See Comments)    Thinks it caused a seizure  . Morphine And Codeine Other (See Comments)    Thinks it caused a seizure    ROS Review of Systems Negative unless indicated in HPI.    Objective:    Physical Exam  BP 120/78 (BP Location: Right Arm, Patient Position: Sitting, Cuff Size: Normal)   Pulse 87   Temp 98.9 F (37.2 C) (Oral)   Ht 5' 10 (1.778 m)   Wt 236 lb 3.2 oz (107.1 kg)   SpO2 96%   BMI 33.89 kg/m  Wt Readings from Last 3 Encounters:  05/06/24 236 lb 3.2 oz (107.1 kg)  11/05/23 232 lb 6.4 oz (105.4 kg)  08/06/23 234 lb (106.1 kg)     Health Maintenance  Topic Date Due  . HIV Screening  Never done  . Hepatitis C Screening  Never done  . Hepatitis B Vaccines (1  of 3 - 19+ 3-dose series) Never done  . HPV VACCINES (1 - 3-dose SCDM series) Never done  . INFLUENZA VACCINE  05/13/2024  . COVID-19 Vaccine (4 - 2024-25 season) 05/21/2024 (Originally 06/14/2023)  . DTaP/Tdap/Td (2 - Td or Tdap) 05/06/2034  . Meningococcal B Vaccine  Aged Out       Topic Date Due  . Hepatitis B Vaccines (1 of 3 - 19+ 3-dose series) Never done  . HPV VACCINES (1 - 3-dose SCDM series) Never done    Lab Results  Component Value Date   TSH 1.07 05/06/2024   Lab Results  Component Value Date   WBC 7.9 05/06/2024   HGB 15.3 05/06/2024   HCT 45.5 05/06/2024   MCV 87.0 05/06/2024   PLT 379.0 05/06/2024   Lab Results  Component Value Date   NA 139 05/06/2024   K 4.1 05/06/2024   CO2 26 05/06/2024   GLUCOSE 93 05/06/2024   BUN 9 05/06/2024   CREATININE 1.19 05/06/2024   BILITOT 0.4 05/06/2024   ALKPHOS 71 05/06/2024   AST 21 05/06/2024   ALT 33 05/06/2024   PROT 7.2 05/06/2024   ALBUMIN 4.7 05/06/2024   CALCIUM 9.7 05/06/2024   ANIONGAP 13 07/17/2017   EGFR 78 12/12/2022   GFR 78.84 05/06/2024   Lab Results  Component Value Date   CHOL 237 (H) 05/06/2024   Lab Results  Component Value Date   HDL 42.90 05/06/2024   Lab Results  Component Value Date   LDLCALC 163 (H) 05/06/2024   Lab Results  Component Value Date   TRIG 155.0 (H) 05/06/2024   Lab Results  Component Value Date   CHOLHDL 6 05/06/2024   No results found for: HGBA1C    Assessment & Plan:  Hyperlipidemia, unspecified hyperlipidemia type -     Lipid panel  History of seizures  Anxiety and depression -     CBC with Differential/Platelet -     Comprehensive metabolic panel with GFR -     TSH  Influenza vaccine needed -     Tdap vaccine greater than or equal to 7yo IM  Need for diphtheria-tetanus-pertussis (Tdap) vaccine    Follow-up: No follow-ups on file.   Kiley Solimine, NP

## 2024-05-16 ENCOUNTER — Ambulatory Visit: Payer: Self-pay | Admitting: Nurse Practitioner

## 2024-05-16 NOTE — Assessment & Plan Note (Signed)
 Will check lipid panel

## 2024-05-16 NOTE — Assessment & Plan Note (Signed)
 Followed by psychiatry

## 2024-05-16 NOTE — Assessment & Plan Note (Signed)
 Stable on medication. - Continue lamotrigine  200mg  daily.

## 2024-05-16 NOTE — Assessment & Plan Note (Signed)
 Stable on medication
